# Patient Record
Sex: Female | Born: 1975 | Race: Black or African American | Hispanic: No | Marital: Single | Smoking: Never smoker
Health system: Southern US, Community
[De-identification: ages and names within clinical notes are randomized; demographics above are authoritative.]

## PROBLEM LIST (undated history)

## (undated) DIAGNOSIS — E119 Type 2 diabetes mellitus without complications: Secondary | ICD-10-CM

## (undated) DIAGNOSIS — I10 Essential (primary) hypertension: Secondary | ICD-10-CM

## (undated) HISTORY — PX: TUBAL LIGATION: SHX77

## (undated) HISTORY — PX: KIDNEY CYST REMOVAL: SHX684

---

## 2020-01-17 ENCOUNTER — Emergency Department: Payer: No Typology Code available for payment source

## 2020-01-17 ENCOUNTER — Other Ambulatory Visit: Payer: Self-pay

## 2020-01-17 ENCOUNTER — Emergency Department
Admission: EM | Admit: 2020-01-17 | Discharge: 2020-01-17 | Disposition: A | Discharge status: Home / Self Care | Payer: No Typology Code available for payment source | Attending: Emergency Medicine | Admitting: Emergency Medicine

## 2020-01-17 DIAGNOSIS — S60212A Contusion of left wrist, initial encounter: Secondary | ICD-10-CM | POA: Insufficient documentation

## 2020-01-17 DIAGNOSIS — S161XXA Strain of muscle, fascia and tendon at neck level, initial encounter: Secondary | ICD-10-CM | POA: Diagnosis not present

## 2020-01-17 DIAGNOSIS — W2219XA Striking against or struck by other automobile airbag, initial encounter: Secondary | ICD-10-CM | POA: Insufficient documentation

## 2020-01-17 DIAGNOSIS — S199XXA Unspecified injury of neck, initial encounter: Secondary | ICD-10-CM | POA: Diagnosis present

## 2020-01-17 DIAGNOSIS — S7002XA Contusion of left hip, initial encounter: Secondary | ICD-10-CM | POA: Diagnosis not present

## 2020-01-17 DIAGNOSIS — Y999 Unspecified external cause status: Secondary | ICD-10-CM | POA: Insufficient documentation

## 2020-01-17 DIAGNOSIS — Y92410 Unspecified street and highway as the place of occurrence of the external cause: Secondary | ICD-10-CM | POA: Diagnosis not present

## 2020-01-17 DIAGNOSIS — S7012XA Contusion of left thigh, initial encounter: Secondary | ICD-10-CM | POA: Diagnosis not present

## 2020-01-17 DIAGNOSIS — Y939 Activity, unspecified: Secondary | ICD-10-CM | POA: Diagnosis not present

## 2020-01-17 DIAGNOSIS — E119 Type 2 diabetes mellitus without complications: Secondary | ICD-10-CM | POA: Insufficient documentation

## 2020-01-17 DIAGNOSIS — S40022A Contusion of left upper arm, initial encounter: Secondary | ICD-10-CM

## 2020-01-17 DIAGNOSIS — S5012XA Contusion of left forearm, initial encounter: Secondary | ICD-10-CM | POA: Diagnosis not present

## 2020-01-17 DIAGNOSIS — I10 Essential (primary) hypertension: Secondary | ICD-10-CM | POA: Insufficient documentation

## 2020-01-17 DIAGNOSIS — R52 Pain, unspecified: Secondary | ICD-10-CM

## 2020-01-17 HISTORY — DX: Type 2 diabetes mellitus without complications: E11.9

## 2020-01-17 HISTORY — DX: Essential (primary) hypertension: I10

## 2020-01-17 LAB — URINALYSIS, COMPLETE (UACMP) WITH MICROSCOPIC
Bacteria, UA: NONE SEEN
Bilirubin Urine: NEGATIVE
Glucose, UA: >1000 mg/dL — AB
Hgb urine dipstick: NEGATIVE
Ketones, ur: NEGATIVE mg/dL
Leukocytes,Ua: NEGATIVE
Nitrite: NEGATIVE
Protein, ur: NEGATIVE mg/dL
Specific Gravity, Urine: 1.01 (ref 1.005–1.030)
pH: 5.5 (ref 5.0–8.0)

## 2020-01-17 MED ORDER — OXYCODONE-ACETAMINOPHEN 5-325 MG PO TABS: 1.0000 | ORAL_TABLET | Freq: Four times a day (QID) | ORAL | 0 refills | Status: DC | PRN

## 2020-01-17 MED ORDER — METHOCARBAMOL 500 MG PO TABS: ORAL_TABLET | ORAL | 0 refills | Status: DC

## 2020-01-17 MED ORDER — OXYCODONE-ACETAMINOPHEN 5-325 MG PO TABS
1.0000 | ORAL_TABLET | Freq: Once | ORAL | Status: AC
  Administered 2020-01-17: 1 via ORAL
  Filled 2020-01-17: qty 1

## 2020-01-17 MED ORDER — METHOCARBAMOL 500 MG PO TABS: ORAL_TABLET | ORAL | 0 refills | Status: AC

## 2020-01-17 MED ORDER — METHOCARBAMOL 500 MG PO TABS
1000.0000 mg | ORAL_TABLET | Freq: Once | ORAL | Status: AC
  Administered 2020-01-17: 1000 mg via ORAL
  Filled 2020-01-17: qty 2

## 2020-01-17 MED ORDER — OXYCODONE-ACETAMINOPHEN 5-325 MG PO TABS: 1.0000 | ORAL_TABLET | Freq: Four times a day (QID) | ORAL | 0 refills | Status: AC | PRN

## 2020-01-17 NOTE — Discharge Instructions (Signed)
Follow-up with your primary care provider or go to your nearest emergency department if any severe worsening of your symptoms or urgent concerns over the weekend.  Begin taking Robaxin 1 or 2 every 6 hours as needed for muscle spasms.  A prescription for Percocet was also sent to your pharmacy as needed for pain.  You may also take ibuprofen with these 2 medications if needed for soreness and inflammation.  Ice or heat to your muscles as needed for discomfort.  Even with medication you may have soreness and stiffness for the next 4 to 5 days.
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
